# Patient Record
Sex: Female | Born: 1975 | Race: Black or African American | Hispanic: No | Smoking: Current every day smoker
Health system: Southern US, Community
[De-identification: ages and names within clinical notes are randomized; demographics above are authoritative.]

## PROBLEM LIST (undated history)

## (undated) DIAGNOSIS — T07XXXA Unspecified multiple injuries, initial encounter: Secondary | ICD-10-CM

## (undated) DIAGNOSIS — M129 Arthropathy, unspecified: Secondary | ICD-10-CM

## (undated) DIAGNOSIS — E559 Vitamin D deficiency, unspecified: Secondary | ICD-10-CM

## (undated) DIAGNOSIS — G5600 Carpal tunnel syndrome, unspecified upper limb: Secondary | ICD-10-CM

## (undated) DIAGNOSIS — R5383 Other fatigue: Secondary | ICD-10-CM

## (undated) DIAGNOSIS — G43909 Migraine, unspecified, not intractable, without status migrainosus: Secondary | ICD-10-CM

## (undated) HISTORY — DX: Vitamin D deficiency, unspecified: E55.9

## (undated) HISTORY — DX: Carpal tunnel syndrome, unspecified upper limb: G56.00

## (undated) HISTORY — DX: Migraine, unspecified, not intractable, without status migrainosus: G43.909

## (undated) HISTORY — DX: Other fatigue: R53.83

## (undated) HISTORY — PX: KNEE SURGERY: SHX244

## (undated) HISTORY — DX: Arthropathy, unspecified: M12.9

---

## 2008-05-15 ENCOUNTER — Emergency Department (HOSPITAL_COMMUNITY): Admission: EM | Admit: 2008-05-15 | Discharge: 2008-05-16 | Payer: Self-pay | Admitting: Emergency Medicine

## 2008-05-16 ENCOUNTER — Ambulatory Visit (HOSPITAL_COMMUNITY): Admission: RE | Admit: 2008-05-16 | Discharge: 2008-05-16 | Payer: Self-pay | Admitting: Physician Assistant

## 2008-08-12 ENCOUNTER — Emergency Department (HOSPITAL_COMMUNITY): Admission: EM | Admit: 2008-08-12 | Discharge: 2008-08-12 | Payer: Self-pay | Admitting: Emergency Medicine

## 2009-01-10 ENCOUNTER — Emergency Department (HOSPITAL_COMMUNITY): Admission: EM | Admit: 2009-01-10 | Discharge: 2009-01-10 | Payer: Self-pay | Admitting: Emergency Medicine

## 2010-06-26 LAB — COMPREHENSIVE METABOLIC PANEL
Albumin: 3.5 g/dL (ref 3.5–5.2)
BUN: 8 mg/dL (ref 6–23)
CO2: 24 mEq/L (ref 19–32)
Calcium: 9.1 mg/dL (ref 8.4–10.5)
Chloride: 106 mEq/L (ref 96–112)
Creatinine, Ser: 0.86 mg/dL (ref 0.4–1.2)
GFR calc non Af Amer: >60 mL/min (ref >60)
Total Bilirubin: 0.4 mg/dL (ref 0.3–1.2)

## 2010-06-26 LAB — CBC
HCT: 37.6 % (ref 36.0–46.0)
MCHC: 34.4 g/dL (ref 30.0–36.0)
MCV: 81.8 fL (ref 78.0–100.0)
Platelets: 231 10*3/uL (ref 150–400)
WBC: 8.6 10*3/uL (ref 4.0–10.5)

## 2010-06-26 LAB — GC/CHLAMYDIA PROBE AMP, GENITAL
Chlamydia, DNA Probe: NEGATIVE
GC Probe Amp, Genital: NEGATIVE

## 2010-06-26 LAB — URINALYSIS, ROUTINE W REFLEX MICROSCOPIC
Nitrite: NEGATIVE
Specific Gravity, Urine: 1.018 (ref 1.005–1.030)
Urobilinogen, UA: 1 mg/dL (ref 0.0–1.0)

## 2010-06-26 LAB — DIFFERENTIAL
Basophils Absolute: 0.2 10*3/uL — ABNORMAL HIGH (ref 0.0–0.1)
Lymphocytes Relative: 36 % (ref 12–46)
Lymphs Abs: 3.1 10*3/uL (ref 0.7–4.0)
Neutro Abs: 4 10*3/uL (ref 1.7–7.7)

## 2010-06-26 LAB — URINE MICROSCOPIC-ADD ON

## 2010-06-26 LAB — LIPASE, BLOOD: Lipase: 24 U/L (ref 11–59)

## 2013-06-28 ENCOUNTER — Emergency Department (HOSPITAL_COMMUNITY)
Admission: EM | Admit: 2013-06-28 | Discharge: 2013-06-28 | Disposition: A | Discharge status: Home / Self Care | Payer: Self-pay | Attending: Emergency Medicine | Admitting: Emergency Medicine

## 2013-06-28 ENCOUNTER — Encounter (HOSPITAL_COMMUNITY): Payer: Self-pay | Admitting: Emergency Medicine

## 2013-06-28 ENCOUNTER — Emergency Department (HOSPITAL_COMMUNITY): Payer: Self-pay

## 2013-06-28 DIAGNOSIS — W1789XA Other fall from one level to another, initial encounter: Secondary | ICD-10-CM | POA: Insufficient documentation

## 2013-06-28 DIAGNOSIS — S6990XA Unspecified injury of unspecified wrist, hand and finger(s), initial encounter: Secondary | ICD-10-CM | POA: Insufficient documentation

## 2013-06-28 DIAGNOSIS — Y92009 Unspecified place in unspecified non-institutional (private) residence as the place of occurrence of the external cause: Secondary | ICD-10-CM | POA: Insufficient documentation

## 2013-06-28 DIAGNOSIS — S42253A Displaced fracture of greater tuberosity of unspecified humerus, initial encounter for closed fracture: Secondary | ICD-10-CM | POA: Diagnosis present

## 2013-06-28 DIAGNOSIS — Y9389 Activity, other specified: Secondary | ICD-10-CM | POA: Insufficient documentation

## 2013-06-28 HISTORY — DX: Unspecified multiple injuries, initial encounter: T07.XXXA

## 2013-06-28 MED ORDER — OXYCODONE-ACETAMINOPHEN 5-325 MG PO TABS: 1.0000 | ORAL_TABLET | Freq: Four times a day (QID) | ORAL | Status: DC | PRN

## 2013-06-28 MED ORDER — ONDANSETRON 4 MG PO TBDP
4.0000 mg | ORAL_TABLET | Freq: Once | ORAL | Status: AC
  Administered 2013-06-28: 4 mg via ORAL
  Filled 2013-06-28: qty 1

## 2013-06-28 MED ORDER — HYDROMORPHONE HCL PF 1 MG/ML IJ SOLN
1.0000 mg | Freq: Once | INTRAMUSCULAR | Status: AC
  Administered 2013-06-28: 1 mg via INTRAMUSCULAR
  Filled 2013-06-28: qty 1

## 2013-06-28 MED ORDER — OXYCODONE-ACETAMINOPHEN 5-325 MG PO TABS
1.0000 | ORAL_TABLET | Freq: Once | ORAL | Status: AC
  Administered 2013-06-28: 1 via ORAL
  Filled 2013-06-28: qty 1

## 2013-06-28 NOTE — ED Notes (Signed)
Patient transported to X-ray 

## 2013-06-28 NOTE — Discharge Instructions (Signed)
Fracture A fracture is a break in a bone, due to a force on the bone that is greater than the bone's strength can handle. There are many types of fractures, including:  Complete fracture: The break passes completely through the bone.  Displaced: The ends of the bone fragments are not properly aligned.  Non-displaced: The ends of the bone fragments are in proper alignment.  Incomplete fracture (greenstick): The break does not pass completely through the bone. Incomplete fractures may or may not be angular (angulated).  Open fracture (compound): Part of the broken bone pokes through the skin. Open fractures have a high risk for infection.  Closed fracture: The fracture has not broken through the skin.  Comminuted fracture: The bone is broken into more than two pieces.  Compression fracture: The break occurs from extreme pressure on the bone (includes crushing injury).  Impacted fracture: The broken bone ends have been driven into each other.  Avulsion fracture: A ligament or tendon pulls a small piece of bone off from the main bony segment.  Pathologic fracture: A fracture due to the bone being made weak by a disease (osteoporosis or tumors).  Stress fracture: A fracture caused by intense exercise or repetitive and prolonged pressure that makes the bone weak. SYMPTOMS   Pain, tenderness, bleeding, bruising, and swelling at the fracture site.  Weakness and inability to bear weight on the injured extremity.  Paleness and deformity (sometimes).  Loss of pulse, numbness, tingling, or paralysis below the fracture site (usually a limb); these are emergencies. CAUSES  Bone being subjected to a force greater than its strength. RISK INCREASES WITH:  Contact sports and falls from heights.  Previous or current bone problems (osteoporosis or tumors).  Poor balance.  Poor strength and flexibility. PREVENTION   Warm up and stretch properly before activity.  Maintain physical  fitness:  Cardiovascular fitness.  Muscle strength.  Flexibility and endurance.  Wear proper protective equipment.  Use proper exercise technique. RELATED COMPLICATIONS   Bone fails to heal (nonunion).  Bone heals in a poor position (malunion).  Low blood volume (hypovolemic), shock due to blood loss.  Clump of fat cells travels through the blood (fat embolus) from the injury site to the lungs or brain (more common with thigh fractures).  Obstruction of nearby arteries. TREATMENT  Treatment first requires realigning of the bones (reduction) by a medically trained person, if the fracture is displaced. After realignment if the fracture is completed, or for non-displaced fractures, ice and medicine are used to reduce pain and inflammation. The bone and adjacent joints are then restrained with a splint, cast, or brace to allow the bones to heal without moving. Surgery is sometimes needed, to reposition the bones and hold the position with rods, pins, plates, or screws. Restraint for long periods of time may result in muscle and joint weakness or build up of fluid in tissues (edema). For this reason, physical therapy is often needed to regain strength and full range of motion. Recovery is complete when there is no bone motion at the fracture site and x-rays (radiographs) show complete healing.  MEDICATION   General anesthesia, sedation, or muscle relaxants may be needed to allow for realignment of the fracture. If pain medicine is needed, nonsteroidal anti-inflammatory medicines (aspirin and ibuprofen), or other minor pain relievers (acetaminophen), are often advised.  Do not take pain medicine for 7 days before surgery.  Stronger pain relievers may be prescribed by your caregiver. Use only as directed and only as much   as you need. SEEK MEDICAL CARE IF:   The following occur after restraint or surgery. (Report any of these signs immediately):  Swelling above or below the fracture  site.  Severe, persistent pain.  Blue or gray skin below the fracture site, especially under the nails. Numbness or loss of feeling below the fracture site. Document Released: 03/02/2005 Document Revised: 02/17/2012 Document Reviewed: 06/14/2008 [employer] Patient Information 2014 [employer].  

## 2013-06-28 NOTE — ED Provider Notes (Signed)
CSN: [id number]     Arrival date & time 06/28/13  1056 History   First MD Initiated Contact with Patient 06/28/13 1110     Chief Complaint  Patient presents with  . Shoulder Pain     (Consider location/radiation/quality/duration/timing/severity/associated sxs/prior Treatment) Patient is a 38 y.o. female presenting with shoulder pain and fall. The history is provided by the patient.  Shoulder Pain This is a new problem. The current episode started yesterday. Episode frequency: constant. The problem has not changed since onset.Pertinent negatives include no chest pain, no abdominal pain, no headaches and no shortness of breath. Exacerbated by: movement. Nothing relieves the symptoms. She has tried nothing for the symptoms. The treatment provided no relief.  Fall This is a new problem. The current episode started yesterday. Episode frequency: once. The problem has been resolved. Pertinent negatives include no chest pain, no abdominal pain, no headaches and no shortness of breath. Nothing aggravates the symptoms. Nothing relieves the symptoms. She has tried nothing for the symptoms. The treatment provided no relief.    History reviewed. No pertinent past medical history. No past surgical history on file. No family history on file. History  Substance Use Topics  . Smoking status: Not on file  . Smokeless tobacco: Not on file  . Alcohol Use: Not on file   OB History   Grav Para Term Preterm Abortions TAB SAB Ect Mult Living                 Review of Systems  Constitutional: Negative for fever and fatigue.  HENT: Negative for congestion and drooling.   Eyes: Negative for pain.  Respiratory: Negative for cough and shortness of breath.   Cardiovascular: Negative for chest pain.  Gastrointestinal: Negative for nausea, vomiting, abdominal pain and diarrhea.  Genitourinary: Negative for dysuria and hematuria.  Musculoskeletal: Negative for back pain, gait problem and neck pain.  Skin:  Negative for color change.  Neurological: Negative for dizziness and headaches.  Hematological: Negative for adenopathy.  Psychiatric/Behavioral: Negative for behavioral problems.  All other systems reviewed and are negative.     Allergies  Review of patient's allergies indicates no known allergies.  Home Medications   Prior to Admission medications   Not on File   BP 134/94  Pulse 93  Temp(Src) 98 F (36.7 C) (Oral)  Resp 20  SpO2 98% Physical Exam  Nursing note and vitals reviewed. Constitutional: She is oriented to person, place, and time. She appears well-developed and well-nourished.  HENT:  Head: Normocephalic.  Mouth/Throat: Oropharynx is clear and moist. No oropharyngeal exudate.  Eyes: Conjunctivae and EOM are normal. Pupils are equal, round, and reactive to light.  Neck: Normal range of motion. Neck supple.  No vertebral ttp.   Cardiovascular: Normal rate, regular rhythm, normal heart sounds and intact distal pulses.  Exam reveals no gallop and no friction rub.   No murmur heard. Pulmonary/Chest: Effort normal and breath sounds normal. No respiratory distress. She has no wheezes.  Abdominal: Soft. Bowel sounds are normal. There is no tenderness. There is no rebound and no guarding.  Musculoskeletal: Normal range of motion. She exhibits tenderness. She exhibits no edema.  Diffuse mild tenderness to palpation of the right shoulder. Moderately limited range of motion due to pain.  Mild tenderness to palpation of the metacarpals of the second third and fourth digits on the right hand.  2+ distal pulses in the upper extremities.  Sensation intact in the upper extremities.  Normal motor skills in the  upper extremities.  Neurological: She is alert and oriented to person, place, and time.  Skin: Skin is warm and dry.  Psychiatric: She has a normal mood and affect. Her behavior is normal.    ED Course  Procedures (including critical care time) Labs Review Labs  Reviewed - No data to display  Imaging Review No results found.   EKG Interpretation None      MDM   Final diagnoses:  Fracture of greater tuberosity of humerus    11:21 AM 38 y.o. female who presents with a mechanical fall which occurred yesterday evening. The patient was leaning on a gait when it gave way and she fell from a porch a proximally 4 feet onto her right shoulder. She denies hitting her head or losing consciousness. She denies any headaches or neck pain. Her only complaint is right shoulder and right hand pain. She appears well on exam. Will get screening imaging and Percocet for pain control.  2:23 PM: Discussed case w/ Dr. Eulah PontMurphy, will get CT, place in sling, and c/u w/ him on Friday.  I have discussed the diagnosis/risks/treatment options with the patient and believe the pt to be eligible for discharge home to follow-up with Dr. Eulah PontMurphy this Friday morning. We also discussed returning to the ED immediately if new or worsening sx occur. We discussed the sx which are most concerning (e.g., worsening pain, numbness, weakness) that necessitate immediate return. Medications administered to the patient during their visit and any new prescriptions provided to the patient are listed below.  Medications given during this visit Medications  oxyCODONE-acetaminophen (PERCOCET/ROXICET) 5-325 MG per tablet 1 tablet (1 tablet Oral Given 06/28/13 1149)  HYDROmorphone (DILAUDID) injection 1 mg (1 mg Intramuscular Given 06/28/13 1339)  ondansetron (ZOFRAN-ODT) disintegrating tablet 4 mg (4 mg Oral Given 06/28/13 1339)    New Prescriptions   OXYCODONE-ACETAMINOPHEN (PERCOCET) 5-325 MG PER TABLET    Take 1-2 tablets by mouth every 6 (six) hours as needed for moderate pain.     Junius ArgyleForrest S Malarie Tappen, MD 06/28/13 [phone]

## 2013-06-28 NOTE — ED Notes (Signed)
Pt is back. 

## 2013-06-28 NOTE — ED Notes (Signed)
Pt states she fell off the porch from the gate giving way. C/o pain to right shoulder and her hand after falling. No LOC.

## 2013-06-28 NOTE — ED Notes (Signed)
Patient transported to CT 

## 2013-07-19 ENCOUNTER — Ambulatory Visit: Payer: Self-pay | Attending: Internal Medicine

## 2016-04-12 ENCOUNTER — Emergency Department (HOSPITAL_COMMUNITY)
Admission: EM | Admit: 2016-04-12 | Discharge: 2016-04-12 | Disposition: A | Discharge status: Home / Self Care | Payer: Self-pay | Attending: Emergency Medicine | Admitting: Emergency Medicine

## 2016-04-12 ENCOUNTER — Encounter (HOSPITAL_COMMUNITY): Payer: Self-pay | Admitting: Emergency Medicine

## 2016-04-12 DIAGNOSIS — G5603 Carpal tunnel syndrome, bilateral upper limbs: Secondary | ICD-10-CM

## 2016-04-12 DIAGNOSIS — F17200 Nicotine dependence, unspecified, uncomplicated: Secondary | ICD-10-CM | POA: Insufficient documentation

## 2016-04-12 MED ORDER — MELOXICAM 7.5 MG PO TABS: 7.5000 mg | ORAL_TABLET | Freq: Every day | ORAL | 0 refills | Status: DC

## 2016-04-12 NOTE — ED Notes (Signed)
Wrist splints applied to both wrists by ED staff.

## 2016-04-12 NOTE — ED Triage Notes (Signed)
Pt. 

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
## 2017-04-07 ENCOUNTER — Encounter (HOSPITAL_COMMUNITY): Payer: Self-pay | Admitting: Emergency Medicine

## 2017-04-07 ENCOUNTER — Emergency Department (HOSPITAL_COMMUNITY)
Admission: EM | Admit: 2017-04-07 | Discharge: 2017-04-07 | Disposition: A | Discharge status: Home / Self Care | Payer: Self-pay | Attending: Emergency Medicine | Admitting: Emergency Medicine

## 2017-04-07 ENCOUNTER — Emergency Department (HOSPITAL_COMMUNITY): Payer: Self-pay

## 2017-04-07 ENCOUNTER — Other Ambulatory Visit: Payer: Self-pay

## 2017-04-07 DIAGNOSIS — F17210 Nicotine dependence, cigarettes, uncomplicated: Secondary | ICD-10-CM | POA: Insufficient documentation

## 2017-04-07 DIAGNOSIS — Z79899 Other long term (current) drug therapy: Secondary | ICD-10-CM | POA: Insufficient documentation

## 2017-04-07 DIAGNOSIS — L03011 Cellulitis of right finger: Secondary | ICD-10-CM

## 2017-04-07 MED ORDER — LIDOCAINE HCL (PF) 1 % IJ SOLN
30.0000 mL | Freq: Once | INTRAMUSCULAR | Status: AC
  Administered 2017-04-07: 30 mL via INTRADERMAL
  Filled 2017-04-07: qty 30

## 2017-04-07 MED ORDER — DOXYCYCLINE HYCLATE 100 MG PO CAPS: 100.0000 mg | ORAL_CAPSULE | Freq: Two times a day (BID) | ORAL | 0 refills | Status: DC

## 2017-04-07 NOTE — ED Triage Notes (Signed)
Pt states that she got [place] in [place] approx 1 week ago she reports pain and swelling to her right middle finger starting Sunday. She does not remember any specific injury to finger/hand
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
## 2018-11-01 IMAGING — CR DG FINGER MIDDLE 2+V*R*
3 series · 3 of 3 positions shown · non-contrast
Comparison: None.

CLINICAL DATA: Swelling and pain

EXAM:
RIGHT MIDDLE FINGER 2+V

[finger ap]
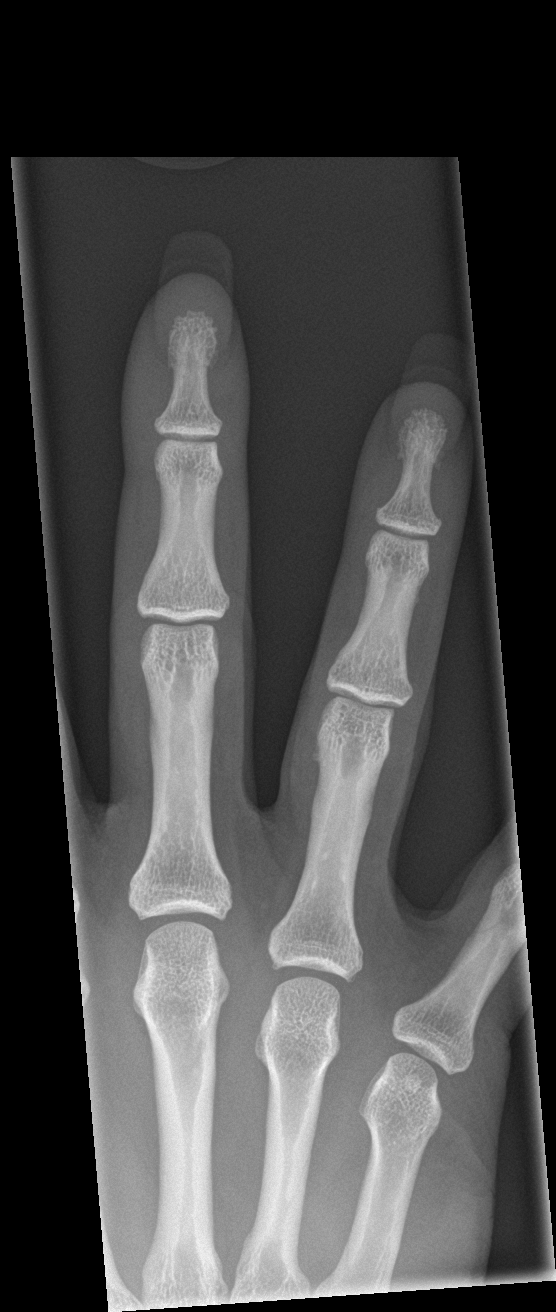

[finger lat]
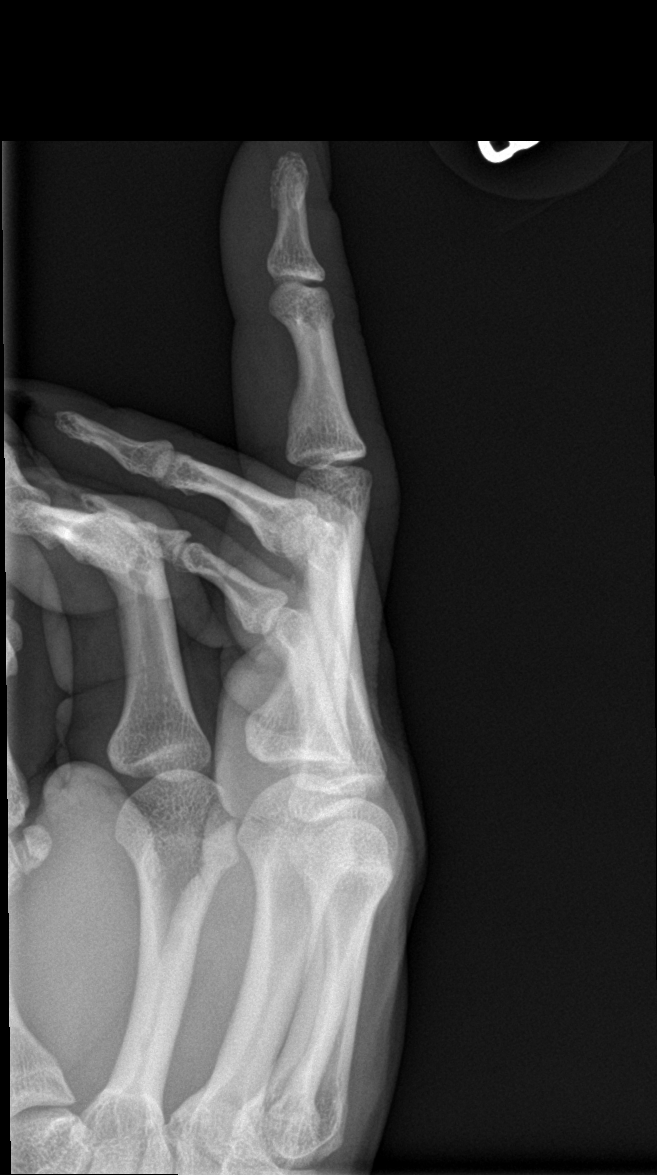

[finger obl]
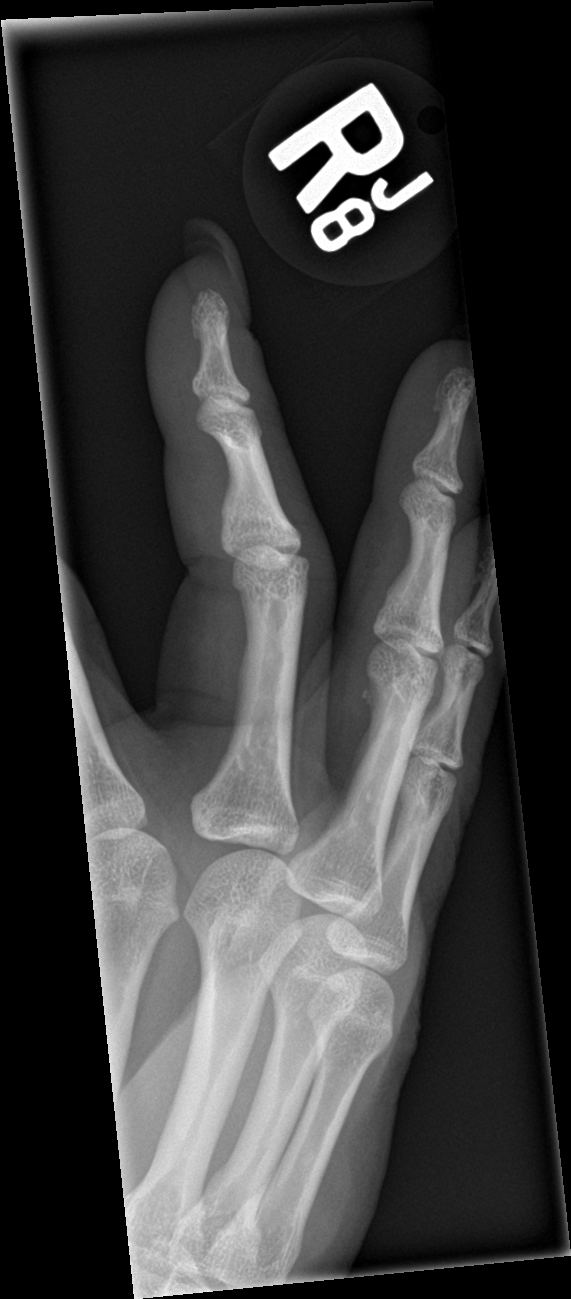

[3 of 3 positions shown; findings below may reference images not displayed]

FINDINGS: There is no evidence of fracture or dislocation. There is no
evidence of arthropathy or other focal bone abnormality. Soft tissue
swelling is present.
IMPRESSION: No acute osseous abnormality

## 2018-12-30 ENCOUNTER — Other Ambulatory Visit: Payer: Self-pay

## 2018-12-30 DIAGNOSIS — Z20822 Contact with and (suspected) exposure to covid-19: Secondary | ICD-10-CM

## 2019-01-01 LAB — NOVEL CORONAVIRUS, NAA: SARS-CoV-2, NAA: NOT DETECTED

## 2020-02-14 ENCOUNTER — Encounter: Payer: Self-pay | Admitting: *Deleted

## 2020-02-16 ENCOUNTER — Other Ambulatory Visit: Payer: Self-pay

## 2020-02-16 ENCOUNTER — Ambulatory Visit (INDEPENDENT_AMBULATORY_CARE_PROVIDER_SITE_OTHER): Payer: 59 | Admitting: Neurology

## 2020-02-16 ENCOUNTER — Encounter: Payer: Self-pay | Admitting: Neurology

## 2020-02-16 VITALS — BP 123/79 | HR 81 | Ht 61.5 in | Wt 178.0 lb

## 2020-02-16 DIAGNOSIS — R29898 Other symptoms and signs involving the musculoskeletal system: Secondary | ICD-10-CM | POA: Diagnosis not present

## 2020-02-16 DIAGNOSIS — G5603 Carpal tunnel syndrome, bilateral upper limbs: Secondary | ICD-10-CM

## 2020-02-16 DIAGNOSIS — R202 Paresthesia of skin: Secondary | ICD-10-CM

## 2020-02-16 DIAGNOSIS — M542 Cervicalgia: Secondary | ICD-10-CM

## 2020-02-16 DIAGNOSIS — M5412 Radiculopathy, cervical region: Secondary | ICD-10-CM | POA: Diagnosis not present

## 2020-02-16 DIAGNOSIS — G5621 Lesion of ulnar nerve, right upper limb: Secondary | ICD-10-CM

## 2020-02-16 DIAGNOSIS — G8929 Other chronic pain: Secondary | ICD-10-CM | POA: Insufficient documentation

## 2020-02-16 MED ORDER — GABAPENTIN 300 MG PO CAPS: 300.0000 mg | ORAL_CAPSULE | Freq: Every day | ORAL | 3 refills | Status: AC

## 2020-02-16 NOTE — Patient Instructions (Addendum)
Emg/ncs MRI of the cervical spine    Electromyoneurogram Electromyoneurogram is a test to check how well your muscles and nerves are working. This procedure includes the combined use of electromyogram (EMG) and nerve conduction study (NCS). EMG is used to look for muscular disorders. NCS, which is also called electroneurogram, measures how well your nerves are controlling your muscles. The procedures are usually done together to check if your muscles and nerves are healthy. If the results of the tests are abnormal, this may indicate disease or injury, such as a neuromuscular disease or peripheral nerve damage. Tell a health care provider about:  Any allergies you have.  All medicines you are taking, including vitamins, herbs, eye drops, creams, and over-the-counter medicines.  Any problems you or family members have had with anesthetic medicines.  Any blood disorders you have.  Any surgeries you have had.  Any medical conditions you have.  If you have a pacemaker.  Whether you are pregnant or may be pregnant. What are the risks? Generally, this is a safe procedure. However, problems may occur, including:  Infection where the electrodes were inserted.  Bleeding. What happens before the procedure? Medicines Ask your health care provider about:  Changing or stopping your regular medicines. This is especially important if you are taking diabetes medicines or blood thinners.  Taking medicines such as aspirin and ibuprofen. These medicines can thin your blood. Do not take these medicines unless your health care provider tells you to take them.  Taking over-the-counter medicines, vitamins, herbs, and supplements. General instructions  Your health care provider may ask you to avoid: ? Beverages that have caffeine, such as coffee and tea. ? Any products that contain nicotine or tobacco. These products include cigarettes, e-cigarettes, and chewing tobacco. If you need help  quitting, ask your health care provider.  Do not use lotions or creams on the same day that you will be having the procedure. What happens during the procedure? For EMG   Your health care provider will ask you to stay in a position so that he or she can access the muscle that will be studied. You may be standing, sitting, or lying down.  You may be given a medicine that numbs the area (local anesthetic).  A very thin needle that has an electrode will be inserted into your muscle.  Another small electrode will be placed on your skin near the muscle.  Your health care provider will ask you to continue to remain still.  The electrodes will send a signal that tells about the electrical activity of your muscles. You may see this on a monitor or hear it in the room.  After your muscles have been studied at rest, your health care provider will ask you to contract or flex your muscles. The electrodes will send a signal that tells about the electrical activity of your muscles.  Your health care provider will remove the electrodes and the electrode needles when the procedure is finished. The procedure may vary among health care providers and hospitals. For NCS   An electrode that records your nerve activity (recording electrode) will be placed on your skin by the muscle that is being studied.  An electrode that is used as a reference (reference electrode) will be placed near the recording electrode.  A paste or gel will be applied to your skin between the recording electrode and the reference electrode.  Your nerve will be stimulated with a mild shock. Your health care provider will measure how  much time it takes for your muscle to react.  Your health care provider will remove the electrodes and the gel when the procedure is finished. The procedure may vary among health care providers and hospitals. What happens after the procedure?  It is up to you to get the results of your procedure.  Ask your health care provider, or the department that is doing the procedure, when your results will be ready.  Your health care provider may: ? Give you medicines for any pain. ? Monitor the insertion sites to make sure that bleeding stops. Summary  Electromyoneurogram is a test to check how well your muscles and nerves are working.  If the results of the tests are abnormal, this may indicate disease or injury.  This is a safe procedure. However, problems may occur, such as bleeding and infection.  Your health care provider will do two tests to complete this procedure. One checks your muscles (EMG) and another checks your nerves (NCS).  It is up to you to get the results of your procedure. Ask your health care provider, or the department that is doing the procedure, when your results will be ready. This information is not intended to replace advice given to you by your health care provider. Make sure you discuss any questions you have with your health care provider. Document Revised: 11/16/2017 Document Reviewed: 10/29/2017 Elsevier Patient Education  2020 [employer]   Carpal Tunnel Syndrome  Carpal tunnel syndrome is a condition that causes pain in your hand and arm. The carpal tunnel is a narrow area that is on the palm side of your wrist. Repeated wrist motion or certain diseases may cause swelling in the tunnel. This swelling can pinch the main nerve in the wrist (median nerve). What are the causes? This condition may be caused by:  Repeated wrist motions.  Wrist injuries.  Arthritis.  A sac of fluid (cyst) or abnormal growth (tumor) in the carpal tunnel.  Fluid buildup during pregnancy. Sometimes the cause is not known. What increases the risk? The following factors may make you more likely to develop this condition:  Having a job in which you move your wrist in the same way many times. This includes jobs like being a Midwife or a Conservation officer, nature.  Being a woman.  Having  other health conditions, such as: ? Diabetes. ? Obesity. ? A thyroid gland that is not active enough (hypothyroidism). ? Kidney failure. What are the signs or symptoms? Symptoms of this condition include:  A tingling feeling in your fingers.  Tingling or a loss of feeling (numbness) in your hand.  Pain in your entire arm. This pain may get worse when you bend your wrist and elbow for a long time.  Pain in your wrist that goes up your arm to your shoulder.  Pain that goes down into your palm or fingers.  A weak feeling in your hands. You may find it hard to grab and hold items. You may feel worse at night. How is this diagnosed? This condition is diagnosed with a medical history and physical exam. You may also have tests, such as:  Electromyogram (EMG). This test checks the signals that the nerves send to the muscles.  Nerve conduction study. This test checks how well signals pass through your nerves.  Imaging tests, such as X-rays, ultrasound, and MRI. These tests check for what might be the cause of your condition. How is this treated? This condition may be treated with:  Lifestyle changes. You  will be asked to stop or change the activity that caused your problem.  Doing exercise and activities that make bones and muscles stronger (physical therapy).  Learning how to use your hand again (occupational therapy).  Medicines for pain and swelling (inflammation). You may have injections in your wrist.  A wrist splint.  Surgery. Follow these instructions at home: If you have a splint:  Wear the splint as told by your doctor. Remove it only as told by your doctor.  Loosen the splint if your fingers: ? Tingle. ? Lose feeling (become numb). ? Turn cold and blue.  Keep the splint clean.  If the splint is not waterproof: ? Do not let it get wet. ? Cover it with a watertight covering when you take a bath or a shower. Managing pain, stiffness, and swelling   If told,  put ice on the painful area: ? If you have a removable splint, remove it as told by your doctor. ? Put ice in a plastic bag. ? Place a towel between your skin and the bag. ? Leave the ice on for 20 minutes, 2-3 times per day. General instructions  Take over-the-counter and prescription medicines only as told by your doctor.  Rest your wrist from any activity that may cause pain. If needed, talk with your boss at work about changes that can help your wrist heal.  Do any exercises as told by your doctor, physical therapist, or occupational therapist.  Keep all follow-up visits as told by your doctor. This is important. Contact a doctor if:  You have new symptoms.  Medicine does not help your pain.  Your symptoms get worse. Get help right away if:  You have very bad numbness or tingling in your wrist or hand. Summary  Carpal tunnel syndrome is a condition that causes pain in your hand and arm.  It is often caused by repeated wrist motions.  Lifestyle changes and medicines are used to treat this problem. Surgery may help in very bad cases.  Follow your doctor's instructions about wearing a splint, resting your wrist, keeping follow-up visits, and calling for help. This information is not intended to replace advice given to you by your health care provider. Make sure you discuss any questions you have with your health care provider. Document Revised: 07/09/2017 Document Reviewed: 07/09/2017 Elsevier Patient Education  2020 [employer]   Ulnar Neuropathy Syndrome  Cubital tunnel syndrome is a condition that causes pain and weakness of the forearm and hand. It happens when one of the nerves that runs along the inside of the elbow joint (ulnar nerve) becomes irritated. This condition is usually caused by repeated arm motions that are done during sports or work-related activities. What are the causes? This condition may be caused by:  Increased pressure on the ulnar nerve at the  elbow, arm, or forearm. This can result from: ? Irritation caused by repeated elbow bending. ? Poorly healed elbow fractures. ? Tumors in the elbow. These are usually noncancerous (benign). ? Scar tissue that develops in the elbow after an injury. ? Bony growths (spurs) near the ulnar nerve.  Stretching of the nerve due to loose elbow ligaments.  Trauma to the nerve at the elbow. What increases the risk? The following factors may make you more likely to develop this condition:  Doing manual labor that requires frequent bending of the elbow.  Playing sports that include repeated or strenuous throwing motions, such as baseball.  Playing contact sports, such as football or  lacrosse.  Not warming up properly before activities.  Having diabetes.  Having an underactive thyroid (hypothyroidism). What are the signs or symptoms? Symptoms of this condition include:  Clumsiness or weakness of the hand.  Tenderness of the inner elbow.  Aching or soreness of the inner elbow, forearm, or fingers, especially the little finger or the ring finger.  Increased pain when forcing the elbow to bend.  Reduced control when throwing objects.  Tingling, numbness, or a burning feeling inside the forearm or in part of the hand or fingers, especially the little finger or the ring finger.  Sharp pains that shoot from the elbow down to the wrist and hand.  The inability to grip or pinch hard. How is this diagnosed? This condition is diagnosed based on:  Your symptoms and medical history. Your health care provider will also ask for details about any injury.  A physical exam. You may also have tests, including:  Electromyogram (EMG). This test measures electrical signals sent by your nerves into the muscles.  Nerve conduction study. This test measures how well electrical signals pass through your nerves.  Imaging tests, such as X-rays, ultrasound, and MRI. These tests check for possible causes  of your condition. How is this treated? This condition may be treated by:  Stopping the activities that are causing your symptoms to get worse.  Icing and taking medicines to reduce pain and swelling.  Wearing a splint to prevent your elbow from bending, or wearing an elbow pad where the ulnar nerve is closest to the skin.  Working with a physical therapist in less severe cases. This may help to: ? Decrease your symptoms. ? Improve the strength and range of motion of your elbow, forearm, and hand. If these treatments do not help, surgery may be needed. Follow these instructions at home: If you have a splint:  Wear the splint as told by your health care provider. Remove it only as told by your health care provider.  Loosen the splint if your fingers tingle, become numb, or turn cold and blue.  Keep the splint clean.  If the splint is not waterproof: ? Do not let it get wet. ? Cover it with a watertight covering when you take a bath or shower. Managing pain, stiffness, and swelling   If directed, put ice on the injured area: ? Put ice in a plastic bag. ? Place a towel between your skin and the bag. ? Leave the ice on for 20 minutes, 2-3 times a day.  Move your fingers often to avoid stiffness and to lessen swelling.  Raise (elevate) the injured area above the level of your heart while you are sitting or lying down. General instructions  Take over-the-counter and prescription medicines only as told by your health care provider.  Do any exercise or physical therapy as told by your health care provider.  Do not drive or use heavy machinery while taking prescription pain medicine.  If you were given an elbow pad, wear it as told by your health care provider.  Keep all follow-up visits as told by your health care provider. This is important. Contact a health care provider if:  Your symptoms get worse.  Your symptoms do not get better with treatment.  You have new  pain.  Your hand on the injured side feels numb or cold. Summary  Cubital tunnel syndrome is a condition that causes pain and weakness of the forearm and hand.  You are more likely to develop this  condition if you do work or play sports that involve repeated arm movements.  This condition is often treated by stopping repetitive activities, applying ice, and using anti-inflammatory medicines.  In rare cases, surgery may be needed. This information is not intended to replace advice given to you by your health care provider. Make sure you discuss any questions you have with your health care provider. Document Revised: 07/19/2017 Document Reviewed: 07/19/2017 Elsevier Patient Education  2020 [employer] Inc[employer]  Gabapentin capsules or tablets What is this medicine? GABAPENTIN (GA ba pen tin) is used to control seizures in certain types of epilepsy. It is also used to treat certain types of nerve pain. This medicine may be used for other purposes; ask your health care provider or pharmacist if you have questions. COMMON BRAND NAME(S): Active-PAC with Gabapentin, Gabarone, Neurontin What should I tell my health care provider before I take this medicine? They need to know if you have any of these conditions:  history of drug abuse or alcohol abuse problem  kidney disease  lung or breathing disease  suicidal thoughts, plans, or attempt; a previous suicide attempt by you or a family member  an unusual or allergic reaction to gabapentin, other medicines, foods, dyes, or preservatives  pregnant or trying to get pregnant  breast-feeding How should I use this medicine? Take this medicine by mouth with a glass of water. Follow the directions on the prescription label. You can take it with or without food. If it upsets your stomach, take it with food. Take your medicine at regular intervals. Do not take it more often than directed. Do not stop taking except on your doctor's advice. If you are  directed to break the 600 or 800 mg tablets in half as part of your dose, the extra half tablet should be used for the next dose. If you have not used the extra half tablet within 28 days, it should be thrown away. A special MedGuide will be given to you by the pharmacist with each prescription and refill. Be sure to read this information carefully each time. Talk to your pediatrician regarding the use of this medicine in children. While this drug may be prescribed for children as young as 3 years for selected conditions, precautions do apply. Overdosage: If you think you have taken too much of this medicine contact a poison control center or emergency room at once. NOTE: This medicine is only for you. Do not share this medicine with others. What if I miss a dose? If you miss a dose, take it as soon as you can. If it is almost time for your next dose, take only that dose. Do not take double or extra doses. What may interact with this medicine? This medicine may interact with the following medications:  alcohol  antihistamines for allergy, cough, and cold  certain medicines for anxiety or sleep  certain medicines for depression like amitriptyline, fluoxetine, sertraline  certain medicines for seizures like phenobarbital, primidone  certain medicines for stomach problems  general anesthetics like halothane, isoflurane, methoxyflurane, propofol  local anesthetics like lidocaine, pramoxine, tetracaine  medicines that relax muscles for surgery  narcotic medicines for pain  phenothiazines like chlorpromazine, mesoridazine, prochlorperazine, thioridazine This list may not describe all possible interactions. Give your health care provider a list of all the medicines, herbs, non-prescription drugs, or dietary supplements you use. Also tell them if you smoke, drink alcohol, or use illegal drugs. Some items may interact with your medicine. What should I watch for  while using this  medicine? Visit your doctor or health care provider for regular checks on your progress. You may want to keep a record at home of how you feel your condition is responding to treatment. You may want to share this information with your doctor or health care provider at each visit. You should contact your doctor or health care provider if your seizures get worse or if you have any new types of seizures. Do not stop taking this medicine or any of your seizure medicines unless instructed by your doctor or health care provider. Stopping your medicine suddenly can increase your seizures or their severity. This medicine may cause serious skin reactions. They can happen weeks to months after starting the medicine. Contact your health care provider right away if you notice fevers or flu-like symptoms with a rash. The rash may be red or purple and then turn into blisters or peeling of the skin. Or, you might notice a red rash with swelling of the face, lips or lymph nodes in your neck or under your arms. Wear a medical identification bracelet or chain if you are taking this medicine for seizures, and carry a card that lists all your medications. You may get drowsy, dizzy, or have blurred vision. Do not drive, use machinery, or do anything that needs mental alertness until you know how this medicine affects you. To reduce dizzy or fainting spells, do not sit or stand up quickly, especially if you are an older patient. Alcohol can increase drowsiness and dizziness. Avoid alcoholic drinks. Your mouth may get dry. Chewing sugarless gum or sucking hard candy, and drinking plenty of water will help. The use of this medicine may increase the chance of suicidal thoughts or actions. Pay special attention to how you are responding while on this medicine. Any worsening of mood, or thoughts of suicide or dying should be reported to your health care provider right away. Women who become pregnant while using this medicine may enroll  in the [place] [employer] Antiepileptic Drug Pregnancy Registry by calling [phone]. This registry collects information about the safety of antiepileptic drug use during pregnancy. What side effects may I notice from receiving this medicine? Side effects that you should report to your doctor or health care professional as soon as possible:  allergic reactions like skin rash, itching or hives, swelling of the face, lips, or tongue  breathing problems  rash, fever, and swollen lymph nodes  redness, blistering, peeling or loosening of the skin, including inside the mouth  suicidal thoughts, mood changes Side effects that usually do not require medical attention (report to your doctor or health care professional if they continue or are bothersome):  dizziness  drowsiness  headache  nausea, vomiting  swelling of ankles, feet, hands  tiredness This list may not describe all possible side effects. Call your doctor for medical advice about side effects. You may report side effects to FDA at 1-800-FDA-1088. Where should I keep my medicine? Keep out of reach of children. This medicine may cause accidental overdose and death if it taken by other adults, children, or pets. Mix any unused medicine with a substance like cat litter or coffee grounds. Then throw the medicine away in a sealed container like a sealed bag or a coffee can with a lid. Do not use the medicine after the expiration date. Store at room temperature between 15 and 30 degrees C (59 and 86 degrees F). NOTE: This sheet is a summary. It may not cover all  possible information. If you have questions about this medicine, talk to your doctor, pharmacist, or health care provider.  2020 Elsevier/Gold Standard (2018-06-03 14:16:43)
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
## 2020-02-19 ENCOUNTER — Telehealth: Payer: Self-pay | Admitting: Neurology

## 2020-02-19 NOTE — Telephone Encounter (Signed)
due to the cost of the MRI she is going to hold off right now she states she will let me know when she is ready to proceed on having the MRI. i did offer her the payment plan  [employer] auth: [id number] (exp. 02/19/20 to 04/04/20)

## 2020-02-26 ENCOUNTER — Other Ambulatory Visit: Payer: Self-pay

## 2020-02-26 ENCOUNTER — Ambulatory Visit (INDEPENDENT_AMBULATORY_CARE_PROVIDER_SITE_OTHER): Payer: 59 | Admitting: Neurology

## 2020-02-26 DIAGNOSIS — G5621 Lesion of ulnar nerve, right upper limb: Secondary | ICD-10-CM

## 2020-02-26 DIAGNOSIS — G5603 Carpal tunnel syndrome, bilateral upper limbs: Secondary | ICD-10-CM

## 2020-02-26 DIAGNOSIS — R202 Paresthesia of skin: Secondary | ICD-10-CM

## 2020-02-26 DIAGNOSIS — Z0289 Encounter for other administrative examinations: Secondary | ICD-10-CM

## 2020-02-26 NOTE — Progress Notes (Signed)
See procedure note.

## 2020-02-26 NOTE — Progress Notes (Signed)
Full Name: Suzanne Stokes Gender: Female MRN #: [id number] Date of Birth: 1975-11-02    Visit Date: 02/26/2020 14:48 Age: 44 Years Examining Physician: Naomie Dean, MD  Referring Physician: Karle Plumber, MD Height: 5 feet 1 inch Patient Weight: 178lbs Hand Temp: 35.4C    History: Patient with episodic numbness in tingling in digits 1-3 of the right hand which is distinct from the episodic numbness she feels often in digits 4-5 of the right hand.  Summary: EMG/NCS was performed on the bilateral upper extremities. Evaluation of the right Ulnar ADM motor nerve showed decreased conduction velocity (A Elbow-B Elbow, 38 m/s, N>49) with an 33m/s drop across the elbow section (N<75m/s).  Evaluation of the right Ulnar FDI motor nerve showed decreased conduction velocity (A Elbow-B Elbow, 35 m/s, N>49) with a 27m/s drop across the elbow section (N<35m/s).  The right Ulnar 5th digit orthodromic sensory nerve showed reduced amplitude (5 V, N>5).  The right median/ulnar (palm) comparison nerve showed prolonged distal peak latency (Median Palm, 2.4 ms, N<2.2) and borderline peak latency difference (Median Palm-Ulnar Palm, 0.3 ms, N<0.4) with a relative median delay.  The right median/radial (dig I) comparison nerve showed abnormal peak latency difference (Median Palm-radial thumb, 1.0 ms, N<0.5) with a relative median delay. All remaining nerves (as indicated in the following tables) were within normal limits. All muscles (as indicated in the following tables) were within normal limits.    Conclusion: 1. 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 2. There is concomitant mild Carpal Tunnel Syndrome of the right wrist. 3. No evidence for polyneuropathy or cervical radiculopathy  Naomie Dean M.D.  [facility] [address], [address] [place], [place] [address] Tel: [phone] Fax: [phone]  Verbal informed consent was obtained from the patient,  patient was informed of potential risk of procedure, including bruising, bleeding, hematoma formation, infection, muscle weakness, muscle pain, numbness, among others.        MNC    Nerve / Sites Muscle Latency Ref. Amplitude Ref. Rel Amp Segments Distance Velocity Ref. Area    ms ms mV mV %  cm m/s m/s mVms  L Median - APB     Wrist APB 3.8 ?4.4 10.9 ?4.0 100 Wrist - APB 7   38.8     Upper arm APB 6.9  10.5  96.3 Upper arm - Wrist 19 60 ?49 39.0  R Median - APB     Wrist APB 4.2 ?4.4 9.3 ?4.0 100 Wrist - APB 7   31.7     Upper arm APB 7.5  8.5  91 Upper arm - Wrist 20 61 ?49 29.7  L Ulnar - ADM     Wrist ADM 2.4 ?3.3 10.0 ?6.0 100 Wrist - ADM 7   43.3     B.Elbow ADM 5.2  9.6  96 B.Elbow - Wrist 18 64 ?49 41.7     A.Elbow ADM 6.8  9.2  96.4 A.Elbow - B.Elbow 10 64 ?49 40.0  R Ulnar - ADM     Wrist ADM 2.6 ?3.3 10.8 ?6.0 100 Wrist - ADM 7   45.0     B.Elbow ADM 6.0  10.5  97.3 B.Elbow - Wrist 19 56 ?49 41.7     A.Elbow ADM 8.6  8.1  77 A.Elbow - B.Elbow 10 38 ?49 41.7  L Ulnar - FDI     Wrist FDI 2.7 ?4.5 12.9 ?7.0 100 Wrist - FDI 8   33.8  B.Elbow FDI 5.6  12.1  94 B.Elbow - Wrist 19 65 ?49 31.8     A.Elbow FDI 7.2  11.9  98.4 A.Elbow - B.Elbow 10 64 ?49 31.0         A.Elbow - Wrist      R Ulnar - FDI     Wrist FDI 3.3 ?4.5 14.6 ?7.0 100 Wrist - FDI 8   39.2     B.Elbow FDI 6.5  14.0  95.4 B.Elbow - Wrist 19 59 ?49 38.9     A.Elbow FDI 9.4  8.8  63 A.Elbow - B.Elbow 10 35 ?49 32.7         A.Elbow - Wrist                      SNC    Nerve / Sites Rec. Site Peak Lat Ref.  Amp Ref. Segments Distance Peak Diff Ref.    ms ms V V  cm ms ms  R Radial - Anatomical snuff box (Forearm)     Forearm Wrist 2.4 ?2.9 53 ?15 Forearm - Wrist 10    L Median, Ulnar - Transcarpal comparison     Median Palm Wrist 2.1 ?2.2 47 ?35 Median Palm - Wrist 8       Ulnar Palm Wrist 2.0 ?2.2 32 ?12 Ulnar Palm - Wrist 8          Median Palm - Ulnar Palm  0.1 ?0.4  R Median, Ulnar - Transcarpal  comparison     Median Palm Wrist 2.4 ?2.2 36 ?35 Median Palm - Wrist 8       Ulnar Palm Wrist 2.0 ?2.2 18 ?12 Ulnar Palm - Wrist 8          Median Palm - Ulnar Palm  0.3 ?0.4  R Median, Radial - Thumb comparison     Median Wrist Thumb 3.4  35  Median Wrist - Thumb 10       Radial Wrist Thumb 2.4  28  Radial Wrist - Thumb 10          Median Wrist - Radial Wrist  1.0 ?0.5  L Median - Orthodromic (Dig II, Mid palm)     Dig II Wrist 3.1 ?3.4 11 ?10 Dig II - Wrist 13    R Median - Orthodromic (Dig II, Mid palm)     Dig II Wrist 3.4 ?3.4 11 ?10 Dig II - Wrist 13    L Ulnar - Orthodromic, (Dig V, Mid palm)     Dig V Wrist 2.7 ?3.1 10 ?5 Dig V - Wrist 11    R Ulnar - Orthodromic, (Dig V, Mid palm)     Dig V Wrist 2.7 ?3.1 5 ?5 Dig V - Wrist 54                       F  Wave    Nerve F Lat Ref.   ms ms  L Ulnar - ADM 25.3 ?32.0  R Ulnar - ADM 28.2 ?32.0         EMG Summary Table    Spontaneous MUAP Recruitment  Muscle IA Fib PSW Fasc Other Amp Dur. Poly Pattern  R. Deltoid Normal None None None _______ Normal Normal Normal Normal  R. Cervical paraspinals (low) Normal None None None _______ Normal Normal Normal Normal  R. Triceps brachii Normal None None None _______ Normal Normal Normal Normal  R. First dorsal interosseous Normal None None None  _______ Normal Normal Normal Normal  R. Flexor digitorum profundus (Ulnar) Normal None None None _______ Normal Normal Normal Normal  R. Pronator teres Normal None None None _______ Normal Normal Normal Normal  R. Opponens pollicis Normal None None None _______ Normal Normal Normal Normal

## 2020-02-27 ENCOUNTER — Telehealth: Payer: Self-pay | Admitting: Neurology

## 2020-02-27 DIAGNOSIS — G5621 Lesion of ulnar nerve, right upper limb: Secondary | ICD-10-CM | POA: Insufficient documentation

## 2020-02-27 DIAGNOSIS — G5603 Carpal tunnel syndrome, bilateral upper limbs: Secondary | ICD-10-CM | POA: Insufficient documentation

## 2020-02-27 NOTE — Telephone Encounter (Signed)
Yes, she can take ibuprofen. I also placed a referral for her to see the hand surgeon Dr. Amanda Pea. And she can take the Gabapentin before she goes to sleep

## 2020-02-27 NOTE — Procedures (Signed)
     Full Name: Suzanne Stokes Gender: Female MRN #: [id number] Date of Birth: 06/20/1975    Visit Date: 02/26/2020 14:48 Age: 44 Years Examining Physician: Fatiha Guzy, MD  Referring Physician: Arvind, Moogali M, MD Height: 5 feet 1 inch Patient Weight: 178lbs Hand Temp: 35.4C    History: Patient with episodic numbness in tingling in digits 1-3 of the right hand which is distinct from the episodic numbness she feels often in digits 4-5 of the right hand.  Summary: EMG/NCS was performed on the bilateral upper extremities. Evaluation of the right Ulnar ADM motor nerve showed decreased conduction velocity (A Elbow-B Elbow, 38 m/s, N>49) with an 18m/s drop across the elbow section (N<10m/s).  Evaluation of the right Ulnar FDI motor nerve showed decreased conduction velocity (A Elbow-B Elbow, 35 m/s, N>49) with a 24m/s drop across the elbow section (N<10m/s).  The right Ulnar 5th digit orthodromic sensory nerve showed reduced amplitude (5 V, N>5).  The right median/ulnar (palm) comparison nerve showed prolonged distal peak latency (Median Palm, 2.4 ms, N<2.2) and borderline peak latency difference (Median Palm-Ulnar Palm, 0.3 ms, N<0.4) with a relative median delay.  The right median/radial (dig I) comparison nerve showed abnormal peak latency difference (Median Palm-radial thumb, 1.0 ms, N<0.5) with a relative median delay. All remaining nerves (as indicated in the following tables) were within normal limits. All muscles (as indicated in the following tables) were within normal limits.    Conclusion: 1. 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 2. There is concomitant mild Carpal Tunnel Syndrome of the right wrist. 3. No evidence for polyneuropathy or cervical radiculopathy  Antionio Negron M.D.  [facility] [address], [address] [place], [place] [address] Tel: [phone] Fax: [phone]  Verbal informed consent was obtained from the patient,  patient was informed of potential risk of procedure, including bruising, bleeding, hematoma formation, infection, muscle weakness, muscle pain, numbness, among others.        MNC    Nerve / Sites Muscle Latency Ref. Amplitude Ref. Rel Amp Segments Distance Velocity Ref. Area    ms ms mV mV %  cm m/s m/s mVms  L Median - APB     Wrist APB 3.8 ?4.4 10.9 ?4.0 100 Wrist - APB 7   38.8     Upper arm APB 6.9  10.5  96.3 Upper arm - Wrist 19 60 ?49 39.0  R Median - APB     Wrist APB 4.2 ?4.4 9.3 ?4.0 100 Wrist - APB 7   31.7     Upper arm APB 7.5  8.5  91 Upper arm - Wrist 20 61 ?49 29.7  L Ulnar - ADM     Wrist ADM 2.4 ?3.3 10.0 ?6.0 100 Wrist - ADM 7   43.3     B.Elbow ADM 5.2  9.6  96 B.Elbow - Wrist 18 64 ?49 41.7     A.Elbow ADM 6.8  9.2  96.4 A.Elbow - B.Elbow 10 64 ?49 40.0  R Ulnar - ADM     Wrist ADM 2.6 ?3.3 10.8 ?6.0 100 Wrist - ADM 7   45.0     B.Elbow ADM 6.0  10.5  97.3 B.Elbow - Wrist 19 56 ?49 41.7     A.Elbow ADM 8.6  8.1  77 A.Elbow - B.Elbow 10 38 ?49 41.7  L Ulnar - FDI     Wrist FDI 2.7 ?4.5 12.9 ?7.0 100 Wrist - FDI 8   33.8       B.Elbow FDI 5.6  12.1  94 B.Elbow - Wrist 19 65 ?49 31.8     A.Elbow FDI 7.2  11.9  98.4 A.Elbow - B.Elbow 10 64 ?49 31.0         A.Elbow - Wrist      R Ulnar - FDI     Wrist FDI 3.3 ?4.5 14.6 ?7.0 100 Wrist - FDI 8   39.2     B.Elbow FDI 6.5  14.0  95.4 B.Elbow - Wrist 19 59 ?49 38.9     A.Elbow FDI 9.4  8.8  63 A.Elbow - B.Elbow 10 35 ?49 32.7         A.Elbow - Wrist                      SNC    Nerve / Sites Rec. Site Peak Lat Ref.  Amp Ref. Segments Distance Peak Diff Ref.    ms ms V V  cm ms ms  R Radial - Anatomical snuff box (Forearm)     Forearm Wrist 2.4 ?2.9 53 ?15 Forearm - Wrist 10    L Median, Ulnar - Transcarpal comparison     Median Palm Wrist 2.1 ?2.2 47 ?35 Median Palm - Wrist 8       Ulnar Palm Wrist 2.0 ?2.2 32 ?12 Ulnar Palm - Wrist 8          Median Palm - Ulnar Palm  0.1 ?0.4  R Median, Ulnar - Transcarpal  comparison     Median Palm Wrist 2.4 ?2.2 36 ?35 Median Palm - Wrist 8       Ulnar Palm Wrist 2.0 ?2.2 18 ?12 Ulnar Palm - Wrist 8          Median Palm - Ulnar Palm  0.3 ?0.4  R Median, Radial - Thumb comparison     Median Wrist Thumb 3.4  35  Median Wrist - Thumb 10       Radial Wrist Thumb 2.4  28  Radial Wrist - Thumb 10          Median Wrist - Radial Wrist  1.0 ?0.5  L Median - Orthodromic (Dig II, Mid palm)     Dig II Wrist 3.1 ?3.4 11 ?10 Dig II - Wrist 13    R Median - Orthodromic (Dig II, Mid palm)     Dig II Wrist 3.4 ?3.4 11 ?10 Dig II - Wrist 13    L Ulnar - Orthodromic, (Dig V, Mid palm)     Dig V Wrist 2.7 ?3.1 10 ?5 Dig V - Wrist 11    R Ulnar - Orthodromic, (Dig V, Mid palm)     Dig V Wrist 2.7 ?3.1 5 ?5 Dig V - Wrist 54                       F  Wave    Nerve F Lat Ref.   ms ms  L Ulnar - ADM 25.3 ?32.0  R Ulnar - ADM 28.2 ?32.0         EMG Summary Table    Spontaneous MUAP Recruitment  Muscle IA Fib PSW Fasc Other Amp Dur. Poly Pattern  R. Deltoid Normal None None None _______ Normal Normal Normal Normal  R. Cervical paraspinals (low) Normal None None None _______ Normal Normal Normal Normal  R. Triceps brachii Normal None None None _______ Normal Normal Normal Normal  R. First dorsal interosseous Normal None None None  _______ Normal Normal Normal Normal  R. Flexor digitorum profundus (Ulnar) Normal None None None _______ Normal Normal Normal Normal  R. Pronator teres Normal None None None _______ Normal Normal Normal Normal  R. Opponens pollicis Normal None None None _______ Normal Normal Normal Normal

## 2020-02-27 NOTE — Telephone Encounter (Signed)
Pt unsure as to how to take her gabapentin (NEURONTIN) 300 MG capsule since she now works nights.  Pt also stated her arm hurts and wants to know if she can take Ibuprofen 800 for the pain since she is unsure as to how to take her Gabapentin, please call

## 2020-02-28 NOTE — Telephone Encounter (Signed)
I called the patient back after speaking with Dr Lucia Gaskins and advised it should be fine to take Ibuprofen 800 mg but no more than TID for a week. Pt advised to be sure she should d/w PCP but with no known h/o kidney disorder it should be ok. Pt aware Gabapentin ok to take during the day when she goes to bed. Her questions were answered. She verbalized appreciation for the call.

## 2020-06-09 ENCOUNTER — Encounter (HOSPITAL_COMMUNITY): Payer: Self-pay | Admitting: *Deleted

## 2020-06-09 ENCOUNTER — Ambulatory Visit (HOSPITAL_COMMUNITY)
Admission: EM | Admit: 2020-06-09 | Discharge: 2020-06-09 | Disposition: A | Discharge status: Home / Self Care | Payer: 59 | Attending: Student | Admitting: Student

## 2020-06-09 ENCOUNTER — Other Ambulatory Visit: Payer: Self-pay

## 2020-06-09 DIAGNOSIS — J3089 Other allergic rhinitis: Secondary | ICD-10-CM | POA: Diagnosis not present

## 2020-06-09 DIAGNOSIS — Z20822 Contact with and (suspected) exposure to covid-19: Secondary | ICD-10-CM | POA: Diagnosis not present

## 2020-06-09 DIAGNOSIS — J302 Other seasonal allergic rhinitis: Secondary | ICD-10-CM | POA: Diagnosis not present

## 2020-06-09 DIAGNOSIS — J069 Acute upper respiratory infection, unspecified: Secondary | ICD-10-CM | POA: Insufficient documentation

## 2020-06-09 DIAGNOSIS — F17210 Nicotine dependence, cigarettes, uncomplicated: Secondary | ICD-10-CM | POA: Insufficient documentation

## 2020-06-09 MED ORDER — FLUTICASONE PROPIONATE 50 MCG/ACT NA SUSP: 1.0000 | Freq: Two times a day (BID) | NASAL | 2 refills | Status: AC

## 2020-06-09 MED ORDER — CETIRIZINE HCL 10 MG PO TABS: 10.0000 mg | ORAL_TABLET | Freq: Every day | ORAL | 0 refills | Status: AC
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
## 2020-06-09 NOTE — ED Triage Notes (Signed)
Job request a COVID screen before return to work. Pt reports cough and runny nose.

## 2020-06-10 LAB — SARS CORONAVIRUS 2 (TAT 6-24 HRS): SARS Coronavirus 2: NEGATIVE

## 2022-03-26 ENCOUNTER — Encounter (HOSPITAL_BASED_OUTPATIENT_CLINIC_OR_DEPARTMENT_OTHER): Payer: Self-pay

## 2022-03-26 DIAGNOSIS — R1084 Generalized abdominal pain: Secondary | ICD-10-CM | POA: Diagnosis not present

## 2022-03-26 DIAGNOSIS — R1110 Vomiting, unspecified: Secondary | ICD-10-CM | POA: Insufficient documentation

## 2022-03-26 DIAGNOSIS — R197 Diarrhea, unspecified: Secondary | ICD-10-CM | POA: Insufficient documentation

## 2022-03-26 DIAGNOSIS — J029 Acute pharyngitis, unspecified: Secondary | ICD-10-CM | POA: Diagnosis not present

## 2022-03-26 DIAGNOSIS — N9489 Other specified conditions associated with female genital organs and menstrual cycle: Secondary | ICD-10-CM | POA: Insufficient documentation

## 2022-03-26 DIAGNOSIS — Z20822 Contact with and (suspected) exposure to covid-19: Secondary | ICD-10-CM | POA: Diagnosis not present

## 2022-03-26 LAB — RESP PANEL BY RT-PCR (RSV, FLU A&B, COVID)  RVPGX2
Influenza A by PCR: NEGATIVE
Influenza B by PCR: NEGATIVE
Resp Syncytial Virus by PCR: NEGATIVE
SARS Coronavirus 2 by RT PCR: NEGATIVE

## 2022-03-26 NOTE — ED Triage Notes (Signed)
Pt c/o N/V x4 days, chest pressure/pain w inspiration, congestion, sore throat, "diarrhea in the beginning, constipation x2 days"

## 2022-03-27 ENCOUNTER — Emergency Department (HOSPITAL_BASED_OUTPATIENT_CLINIC_OR_DEPARTMENT_OTHER)
Admission: EM | Admit: 2022-03-27 | Discharge: 2022-03-27 | Disposition: A | Discharge status: Home / Self Care | Payer: 59 | Attending: Emergency Medicine | Admitting: Emergency Medicine

## 2022-03-27 ENCOUNTER — Emergency Department (HOSPITAL_BASED_OUTPATIENT_CLINIC_OR_DEPARTMENT_OTHER): Payer: 59

## 2022-03-27 DIAGNOSIS — K529 Noninfective gastroenteritis and colitis, unspecified: Secondary | ICD-10-CM

## 2022-03-27 LAB — COMPREHENSIVE METABOLIC PANEL
ALT: 15 U/L (ref 0–44)
AST: 17 U/L (ref 15–41)
Albumin: 4.2 g/dL (ref 3.5–5.0)
Alkaline Phosphatase: 61 U/L (ref 38–126)
Anion gap: 9 (ref 5–15)
BUN: 10 mg/dL (ref 6–20)
CO2: 23 mmol/L (ref 22–32)
Calcium: 9.2 mg/dL (ref 8.9–10.3)
Chloride: 105 mmol/L (ref 98–111)
Creatinine, Ser: 0.88 mg/dL (ref 0.44–1.00)
GFR, Estimated: >60 mL/min (ref >60)
Glucose, Bld: 84 mg/dL (ref 70–99)
Potassium: 3.3 mmol/L — ABNORMAL LOW (ref 3.5–5.1)
Sodium: 137 mmol/L (ref 135–145)
Total Bilirubin: 0.3 mg/dL (ref 0.3–1.2)
Total Protein: 7.7 g/dL (ref 6.5–8.1)

## 2022-03-27 LAB — CBC WITH DIFFERENTIAL/PLATELET
Abs Immature Granulocytes: 0.02 10*3/uL (ref 0.00–0.07)
Basophils Absolute: 0 10*3/uL (ref 0.0–0.1)
Basophils Relative: 0 %
Eosinophils Absolute: 0.2 10*3/uL (ref 0.0–0.5)
Eosinophils Relative: 3 %
HCT: 36.3 % (ref 36.0–46.0)
Hemoglobin: 11.9 g/dL — ABNORMAL LOW (ref 12.0–15.0)
Immature Granulocytes: 0 %
Lymphocytes Relative: 31 %
Lymphs Abs: 2.1 10*3/uL (ref 0.7–4.0)
MCH: 24 pg — ABNORMAL LOW (ref 26.0–34.0)
MCHC: 32.8 g/dL (ref 30.0–36.0)
MCV: 73.2 fL — ABNORMAL LOW (ref 80.0–100.0)
Monocytes Absolute: 0.7 10*3/uL (ref 0.1–1.0)
Monocytes Relative: 11 %
Neutro Abs: 3.7 10*3/uL (ref 1.7–7.7)
Neutrophils Relative %: 55 %
Platelets: 319 10*3/uL (ref 150–400)
RBC: 4.96 MIL/uL (ref 3.87–5.11)
RDW: 16 % — ABNORMAL HIGH (ref 11.5–15.5)
WBC: 6.8 10*3/uL (ref 4.0–10.5)
nRBC: 0 % (ref 0.0–0.2)

## 2022-03-27 LAB — HCG, SERUM, QUALITATIVE: Preg, Serum: NEGATIVE

## 2022-03-27 LAB — LIPASE, BLOOD: Lipase: 29 U/L (ref 11–51)

## 2022-03-27 MED ORDER — KETOROLAC TROMETHAMINE 30 MG/ML IJ SOLN
30.0000 mg | Freq: Once | INTRAMUSCULAR | Status: AC
  Administered 2022-03-27: 30 mg via INTRAVENOUS
  Filled 2022-03-27: qty 1

## 2022-03-27 MED ORDER — IOHEXOL 300 MG/ML  SOLN
100.0000 mL | Freq: Once | INTRAMUSCULAR | Status: AC | PRN
  Administered 2022-03-27: 100 mL via INTRAVENOUS

## 2022-03-27 MED ORDER — ONDANSETRON 8 MG PO TBDP: ORAL_TABLET | ORAL | 0 refills | Status: AC

## 2022-03-27 MED ORDER — SODIUM CHLORIDE 0.9 % IV BOLUS
1000.0000 mL | Freq: Once | INTRAVENOUS | Status: AC
  Administered 2022-03-27: 1000 mL via INTRAVENOUS

## 2022-03-27 MED ORDER — ONDANSETRON HCL 4 MG/2ML IJ SOLN
4.0000 mg | Freq: Once | INTRAMUSCULAR | Status: AC
  Administered 2022-03-27: 4 mg via INTRAVENOUS
  Filled 2022-03-27: qty 2

## 2022-03-27 NOTE — ED Notes (Signed)
Pt returned from CT scan.

## 2022-03-27 NOTE — Discharge Instructions (Signed)
Begin taking Zofran as prescribed as needed for nausea.  Clear liquids for the next 12 hours, then slowly advance diet as tolerated.  Return to the emergency department if you develop severe abdominal pain, high fevers, bloody stools, or for other new and concerning symptoms.
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
## 2023-09-23 ENCOUNTER — Other Ambulatory Visit: Payer: Self-pay | Admitting: Family Medicine

## 2023-09-23 ENCOUNTER — Ambulatory Visit
Admission: RE | Admit: 2023-09-23 | Discharge: 2023-09-23 | Disposition: A | Discharge status: Home / Self Care | Source: Ambulatory Visit | Attending: Family Medicine | Admitting: Family Medicine

## 2023-09-23 DIAGNOSIS — W19XXXA Unspecified fall, initial encounter: Secondary | ICD-10-CM

## 2023-10-08 ENCOUNTER — Other Ambulatory Visit: Payer: Self-pay | Admitting: Medical Genetics

## 2024-01-06 ENCOUNTER — Other Ambulatory Visit: Payer: Self-pay | Admitting: Medical Genetics

## 2024-01-06 DIAGNOSIS — Z006 Encounter for examination for normal comparison and control in clinical research program: Secondary | ICD-10-CM
